# Patient Record
Sex: Male | Born: 1935 | Hispanic: Yes | Smoking: Former smoker
Health system: Southern US, Community
[De-identification: ages and names within clinical notes are randomized; demographics above are authoritative.]

## PROBLEM LIST (undated history)

## (undated) DIAGNOSIS — D490 Neoplasm of unspecified behavior of digestive system: Secondary | ICD-10-CM

## (undated) HISTORY — PX: COLOSTOMY: SHX63

## (undated) HISTORY — PX: FINGER SURGERY: SHX640

---

## 2017-06-27 ENCOUNTER — Encounter (HOSPITAL_COMMUNITY): Payer: Self-pay | Admitting: Cardiology

## 2017-06-27 ENCOUNTER — Emergency Department (HOSPITAL_COMMUNITY)
Admission: EM | Admit: 2017-06-27 | Discharge: 2017-06-27 | Disposition: A | Discharge status: Home / Self Care | Payer: Medicare Other | Attending: Emergency Medicine | Admitting: Emergency Medicine

## 2017-06-27 ENCOUNTER — Emergency Department (HOSPITAL_COMMUNITY): Payer: Medicare Other

## 2017-06-27 DIAGNOSIS — J189 Pneumonia, unspecified organism: Secondary | ICD-10-CM

## 2017-06-27 DIAGNOSIS — R531 Weakness: Secondary | ICD-10-CM | POA: Insufficient documentation

## 2017-06-27 DIAGNOSIS — Z87891 Personal history of nicotine dependence: Secondary | ICD-10-CM | POA: Insufficient documentation

## 2017-06-27 DIAGNOSIS — R51 Headache: Secondary | ICD-10-CM | POA: Diagnosis not present

## 2017-06-27 DIAGNOSIS — K5900 Constipation, unspecified: Secondary | ICD-10-CM | POA: Insufficient documentation

## 2017-06-27 DIAGNOSIS — R1084 Generalized abdominal pain: Secondary | ICD-10-CM | POA: Diagnosis present

## 2017-06-27 HISTORY — DX: Neoplasm of unspecified behavior of digestive system: D49.0

## 2017-06-27 LAB — COMPREHENSIVE METABOLIC PANEL
ALBUMIN: 3.3 g/dL — AB (ref 3.5–5.0)
ALK PHOS: 85 U/L (ref 38–126)
ALT: 15 U/L — ABNORMAL LOW (ref 17–63)
ANION GAP: 7 (ref 5–15)
AST: 18 U/L (ref 15–41)
BUN: 15 mg/dL (ref 6–20)
CALCIUM: 9.2 mg/dL (ref 8.9–10.3)
CO2: 27 mmol/L (ref 22–32)
Chloride: 105 mmol/L (ref 101–111)
Creatinine, Ser: 0.72 mg/dL (ref 0.61–1.24)
GFR calc Af Amer: >60 mL/min (ref >60)
GFR calc non Af Amer: >60 mL/min (ref >60)
GLUCOSE: 104 mg/dL — AB (ref 65–99)
Potassium: 4.1 mmol/L (ref 3.5–5.1)
SODIUM: 139 mmol/L (ref 135–145)
Total Bilirubin: 0.4 mg/dL (ref 0.3–1.2)
Total Protein: 6.9 g/dL (ref 6.5–8.1)

## 2017-06-27 LAB — URINALYSIS, ROUTINE W REFLEX MICROSCOPIC
Bilirubin Urine: NEGATIVE
Glucose, UA: NEGATIVE mg/dL
Hgb urine dipstick: NEGATIVE
Ketones, ur: NEGATIVE mg/dL
Nitrite: POSITIVE — AB
PROTEIN: NEGATIVE mg/dL
SPECIFIC GRAVITY, URINE: 1.011 (ref 1.005–1.030)
SQUAMOUS EPITHELIAL / LPF: NONE SEEN
pH: 6 (ref 5.0–8.0)

## 2017-06-27 LAB — CBC WITH DIFFERENTIAL/PLATELET
Basophils Absolute: 0 10*3/uL (ref 0.0–0.1)
Basophils Relative: 0 %
EOS ABS: 0.1 10*3/uL (ref 0.0–0.7)
Eosinophils Relative: 2 %
HEMATOCRIT: 32.6 % — AB (ref 39.0–52.0)
Hemoglobin: 10.4 g/dL — ABNORMAL LOW (ref 13.0–17.0)
LYMPHS ABS: 1.3 10*3/uL (ref 0.7–4.0)
LYMPHS PCT: 21 %
MCH: 27.8 pg (ref 26.0–34.0)
MCHC: 31.9 g/dL (ref 30.0–36.0)
MCV: 87.2 fL (ref 78.0–100.0)
MONOS PCT: 8 %
Monocytes Absolute: 0.5 10*3/uL (ref 0.1–1.0)
NEUTROS ABS: 4.2 10*3/uL (ref 1.7–7.7)
NEUTROS PCT: 69 %
Platelets: 260 10*3/uL (ref 150–400)
RBC: 3.74 MIL/uL — ABNORMAL LOW (ref 4.22–5.81)
RDW: 15.8 % — ABNORMAL HIGH (ref 11.5–15.5)
WBC: 6 10*3/uL (ref 4.0–10.5)

## 2017-06-27 LAB — AMMONIA: AMMONIA: 14 umol/L (ref 9–35)

## 2017-06-27 LAB — TROPONIN I: Troponin I: <0.03 ng/mL (ref <0.03)

## 2017-06-27 MED ORDER — IOPAMIDOL (ISOVUE-300) INJECTION 61%
75.0000 mL | Freq: Once | INTRAVENOUS | Status: AC | PRN
Start: 2017-06-27 — End: 2017-06-27
  Administered 2017-06-27: 75 mL via INTRAVENOUS

## 2017-06-27 MED ORDER — AZITHROMYCIN 250 MG PO TABS
500.0000 mg | ORAL_TABLET | Freq: Once | ORAL | Status: AC
  Administered 2017-06-27: 500 mg via ORAL

## 2017-06-27 MED ORDER — AZITHROMYCIN 250 MG PO TABS
1000.0000 mg | ORAL_TABLET | Freq: Once | ORAL | Status: DC
  Filled 2017-06-27: qty 4

## 2017-06-27 MED ORDER — AZITHROMYCIN 250 MG PO TABS: ORAL_TABLET | ORAL | 0 refills | Status: AC

## 2017-06-27 MED ORDER — DEXTROSE 5 % IV SOLN
1.0000 g | Freq: Once | INTRAVENOUS | Status: AC
  Administered 2017-06-27: 1 g via INTRAVENOUS
  Filled 2017-06-27: qty 10

## 2017-06-27 NOTE — Discharge Instructions (Signed)
Your blood work is well within normal limits. There is some abnormality of your urine. A culture has been sent to the lab. The CT scan of your chest shows emphysema, and questions pneumonia. Please use Zithromax daily starting tomorrow. Please increase your fluids, (water, juices, Gatorade). Please call Dr. Meda Coffee to establish a primary care physician. Please return to the emergency department if not improving before you're seen by Dr. Meda Coffee.

## 2017-06-27 NOTE — ED Notes (Signed)
Pt ambulatory to waiting room. Pt verbalized understanding of discharge instructions with assistance from interpreter.

## 2017-06-27 NOTE — ED Notes (Signed)
Pt's daughter states pt doesn't go to the bathroom independently. States he uses a cath.

## 2017-06-27 NOTE — ED Notes (Signed)
Pt gone to xray

## 2017-06-27 NOTE — ED Provider Notes (Signed)
[facility] DEPT Provider Note   CSN: [id number] Arrival date & time: 06/27/17  0175     History   Chief Complaint Chief Complaint  Patient presents with  . Abdominal Pain    HPI Walter Martinez is a 83 y.o. male.  Patient is an 83 year old male who presents to the emergency department with family because of not feeling well, and discomfort near his colostomy site.  Patient does not speak [place]. Family speaks limited [place]. Language interpreter utilized during the interview.  The patient states that he has been in the area for several years, but he goes to [place] if he feels sick. Approximately 2 months ago the patient went to [place] was noted to have some tumors in his colon and had a colostomy. He just returned from [place] 5 days ago. The patient states that he just doesn't feel well. He also has some discomfort at his colostomy site. When asked if he could describe the symptoms with not feeling well, the patient states that he has a lot of things on his mind. He feels that there are things involved in his life right now that he can't change, and he thinks he may be thinking too much.  The patient states he does not have any other medical issues. He denies fever, nausea, vomiting on. The patient previously used alcohol, but has not used alcohol at this time. He denies any illicit drugs. He states that he feels stressed, and has problems with headache now and he has headaches from time to time. His been no unusual cough or upper respiratory symptoms to be reported. No recent injury or trauma to the reported.     The history is provided by the patient and a relative. The history is limited by a language barrier. A language interpreter was used.  Abdominal Pain   Associated symptoms include headaches.    Past Medical History:  Diagnosis Date  . Colon tumor     There are no active problems to display for this patient.   Past Surgical History:  Procedure Laterality  Date  . COLOSTOMY    . FINGER SURGERY         Home Medications    Prior to Admission medications   Not on File    Family History History reviewed. No pertinent family history.  Social History Social History  Substance Use Topics  . Smoking status: Former Research scientist (life sciences)  . Smokeless tobacco: Never Used     Comment: quit smoking 30 years ago.   . Alcohol use No     Comment: states he used to drink      Allergies   Patient has no known allergies.   Review of Systems Review of Systems  Constitutional: Positive for fatigue.  Gastrointestinal: Positive for abdominal pain.  Neurological: Positive for headaches.  Psychiatric/Behavioral:       Stressed  All other systems reviewed and are negative.    Physical Exam Updated Vital Signs There were no vitals taken for this visit.  Physical Exam  Constitutional: He is oriented to person, place, and time. He appears well-developed and well-nourished.  Non-toxic appearance.  HENT:  Head: Normocephalic.  Right Ear: Tympanic membrane and external ear normal.  Left Ear: Tympanic membrane and external ear normal.  Eyes: Pupils are equal, round, and reactive to light. EOM and lids are normal.  Neck: Normal range of motion. Neck supple. Carotid bruit is not present.  Cardiovascular: Normal rate, regular rhythm, normal heart sounds, intact distal pulses and  normal pulses.   Pulmonary/Chest: Breath sounds normal. No respiratory distress.  Abdominal: Soft. Bowel sounds are normal. He exhibits no distension. There is no tenderness. There is no guarding.  Left abdomen colostomy.  Musculoskeletal: Normal range of motion. He exhibits no edema or tenderness.  Lymphadenopathy:       Head (right side): No submandibular adenopathy present.       Head (left side): No submandibular adenopathy present.    He has no cervical adenopathy.  Neurological: He is alert and oriented to person, place, and time. He has normal strength. No cranial nerve  deficit or sensory deficit. Coordination normal.  Skin: Skin is warm and dry. No rash noted.  Psychiatric: He has a normal mood and affect. His speech is normal and behavior is normal.  Nursing note and vitals reviewed.    ED Treatments / Results  Labs (all labs ordered are listed, but only abnormal results are displayed) Labs Reviewed  COMPREHENSIVE METABOLIC PANEL  TROPONIN I  LACTIC ACID, PLASMA  LACTIC ACID, PLASMA  CBC WITH DIFFERENTIAL/PLATELET  URINALYSIS, ROUTINE W REFLEX MICROSCOPIC  AMMONIA    EKG  EKG Interpretation None       Radiology No results found.  Procedures Procedures (including critical care time)  Medications Ordered in ED Medications - No data to display   Initial Impression / Assessment and Plan / ED Course  I have reviewed the triage vital signs and the nursing notes.  Pertinent labs & imaging results that were available during my care of the patient were reviewed by me and considered in my medical decision making (see chart for details).      Final Clinical Impressions(s) / ED Diagnoses MDM Vital signs reviewed.  Principal metabolic panel is well within normal limits. The anion gap is 7. Troponin is less than 0.03. Negative for emergent event. Electrocardiogram shows a normal sinus rhythm. Negative for emergent event. Complete blood count is nonacute. The ammonia level is 14 within normal limits. The urinalysis shows positive nitrates and small leukocyte esterase. There is 0-5 red blood cells 6-30 white blood cells. Urine Culture sent to the lab. The acute abdomen x-ray shows chronic obstructive pulmonary disease changes, and an area suspicious for right lung infiltrates. There is increased stool burden noted throughout the colon colostomy is noted at the left lower quadrant.  Case discussed and pt seen by Dr Lita Mains.  Given the patient's history of recent colon surgery, a CT scan was obtained to evaluate for possible pneumonia, versus  atelectasis, persists carcinoma. The CT scan shows a medium-sized pleural effusion with adjacent right lower lobe opacities questioning atelectasis, there was motion however pneumonia cannot be completely excluded. There is no acute vascular abnormality noted. There was some enlargement of the right pulmonary artery.  Patient was treated in the emergency department with intravenous Rocephin, and oral Zithromax. Prescription for Zithromax given to the patient. The patient is referred to Dr. Meda Coffee to establish a primary care physician and for follow-up of these medical issues. Using the telemetry-interpreter, these results and findings were discussed with the patient in terms which he understands. The plan for him to be evaluated by a primary physician and for follow-up was explained. The need for increase fiber, and or MiraLAX was also explained to the patient. And the need to increase fluids as well as finish the course of the antibiotic was also explained to the patient. Patient and family are in agreement with this plan.    Final diagnoses:  Community acquired  pneumonia of right lung, unspecified part of lung  Weakness  Constipation, unspecified constipation type    New Prescriptions Discharge Medication List as of 06/27/2017  6:51 PM    START taking these medications   Details  azithromycin (ZITHROMAX) 250 MG tablet Take 1 po daily with food, Print         Lily Kocher, PA-C 06/28/17 0907    Lily Kocher, PA-C 06/28/17 9021    Julianne Rice, MD 07/01/17 [phone]

## 2017-06-27 NOTE — ED Notes (Signed)
EKG given to Lily Kocher, PA and Dr. Lita Mains.

## 2017-06-27 NOTE — ED Notes (Signed)
Pt has been here throughout downtime and has had multiple hourly roundings and has been sent to CT.

## 2017-06-27 NOTE — ED Triage Notes (Addendum)
Per translator Pt having pain at colostomy site for 2 months. Colostomy was placed in [place] 2 months ago.    And states he just feels bad.  Pt does not speak [place].

## 2017-06-30 LAB — URINE CULTURE: Culture: >=100000 — AB

## 2017-07-01 ENCOUNTER — Telehealth: Payer: Self-pay

## 2017-07-01 NOTE — Progress Notes (Signed)
ED Antimicrobial Stewardship Positive Culture Follow Up   Walter Martinez is an 83 y.o. male who presented to [facility] on 06/27/2017 with a chief complaint of  Chief Complaint  Patient presents with  . Abdominal Pain    Recent Results (from the past 720 hour(s))  Urine culture     Status: Abnormal   Collection Time: 06/27/17  6:25 PM  Result Value Ref Range Status   Specimen Description URINE, CLEAN CATCH  Final   Special Requests NONE  Final   Culture (A)  Final    >=100,000 COLONIES/mL ESCHERICHIA COLI Confirmed Extended Spectrum Beta-Lactamase Producer (ESBL) Performed at [facility], [address] [place], [place] [address]    Report Status 06/30/2017 FINAL  Final   Organism ID, Bacteria ESCHERICHIA COLI (A)  Final      Susceptibility   Escherichia coli - MIC*    AMPICILLIN >=32 RESISTANT Resistant     CEFAZOLIN >=64 RESISTANT Resistant     CEFTRIAXONE >=64 RESISTANT Resistant     CIPROFLOXACIN >=4 RESISTANT Resistant     GENTAMICIN <=1 SENSITIVE Sensitive     IMIPENEM <=0.25 SENSITIVE Sensitive     NITROFURANTOIN 64 INTERMEDIATE Intermediate     TRIMETH/SULFA >=320 RESISTANT Resistant     AMPICILLIN/SULBACTAM 16 INTERMEDIATE Intermediate     PIP/TAZO <=4 SENSITIVE Sensitive     Extended ESBL POSITIVE Resistant     * >=100,000 COLONIES/mL ESCHERICHIA COLI    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 No urinary symptoms. Discharged on abx for possible PNA.  New antibiotic prescription: No further treatment necessary  ED Provider: Arlean Hopping, [place]  Elicia Lamp, PharmD, BCPS Clinical Pharmacist 07/01/2017 9:58 AM

## 2017-07-01 NOTE — Telephone Encounter (Signed)
No further treatment for [facility] ED 06/27/17 per Arlean Hopping PA

## 2018-06-24 IMAGING — CT CT CHEST W/ CM
2 of 3 series · 15 of 36 positions shown, 18 images · IV contrast (iopamidol)
Comparison: Chest radiograph 06/27/2017

CLINICAL DATA: Suspected right lung base pneumonia.

EXAM:
CT CHEST WITH CONTRAST
TECHNIQUE: Multidetector CT imaging of the chest was performed during
intravenous contrast administration.
CONTRAST:  75mL [id number] IOPAMIDOL ([id number]) INJECTION 61%

[Series 2: axial st · axial · 0.65mm/px · z∈[-244,+28]mm · 12 of 160 slices shown, 15 images]
[im 12/160  mediastinal]
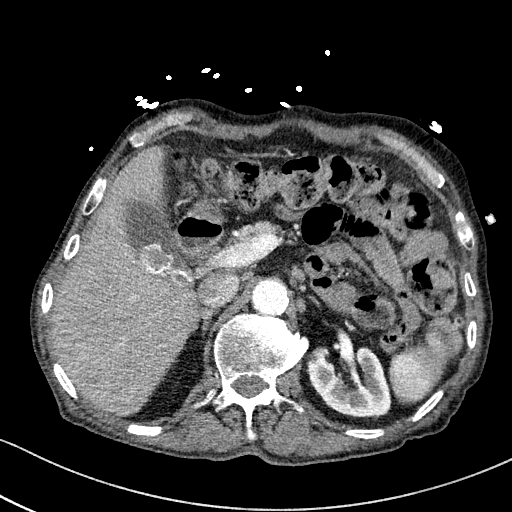
[im 12/160  lung]
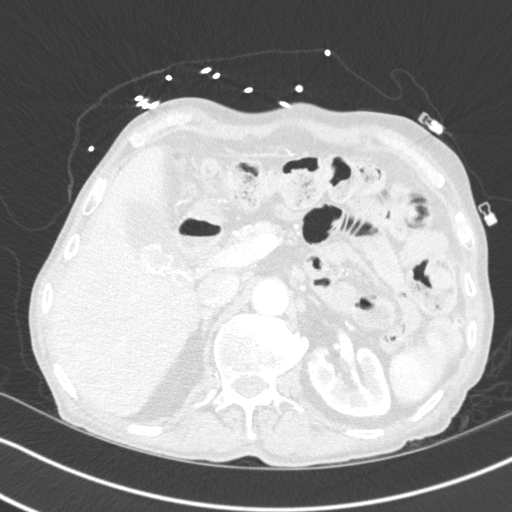
[im 24/160  lung]
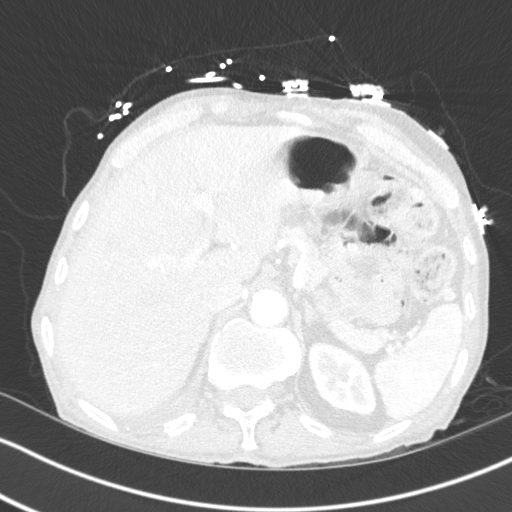
[im 36/160  lung]
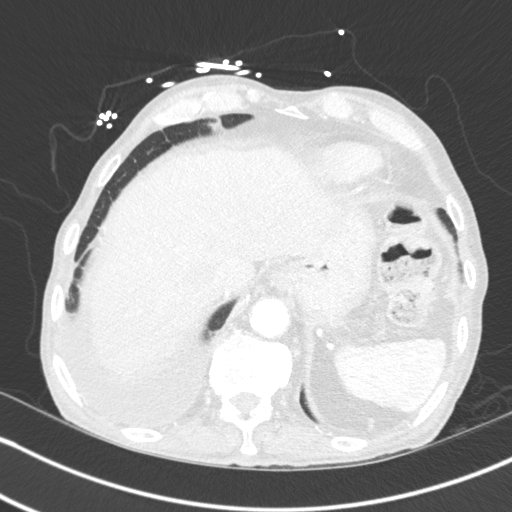
[im 48/160  lung]
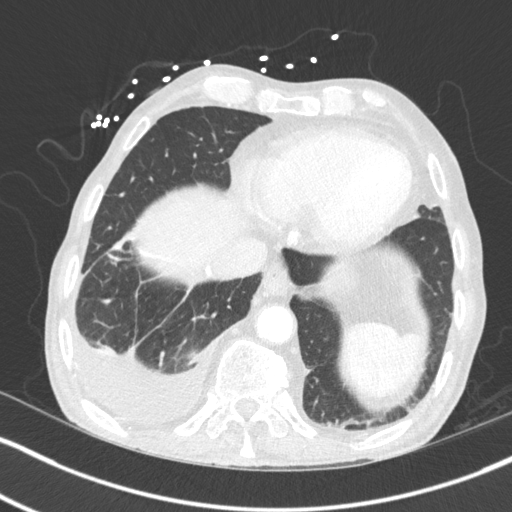
[im 59/160  mediastinal]
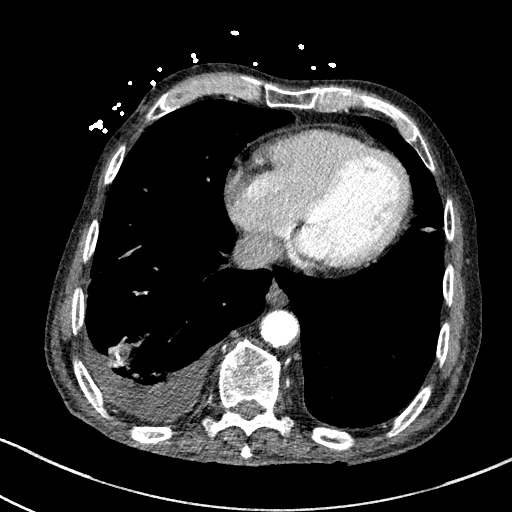
[im 59/160  lung]
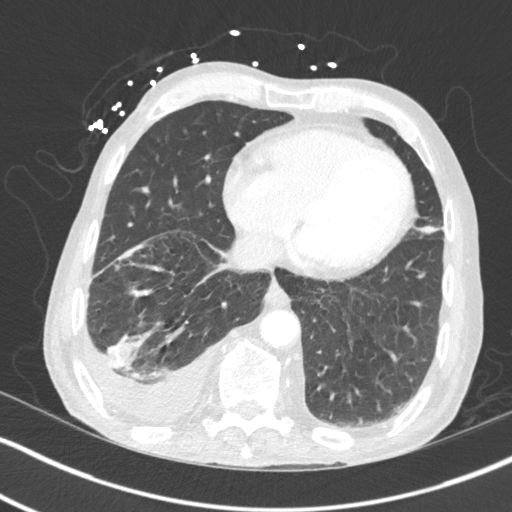
[im 71/160  lung]
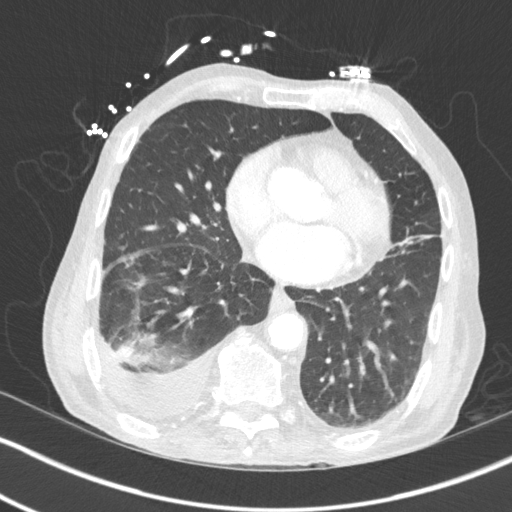
[im 89/160  lung]
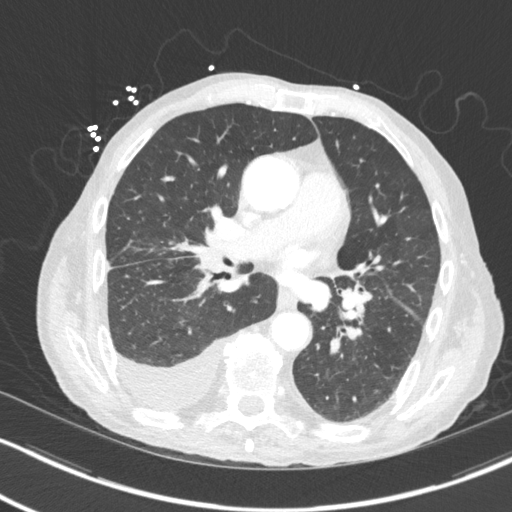
[im 101/160  lung]
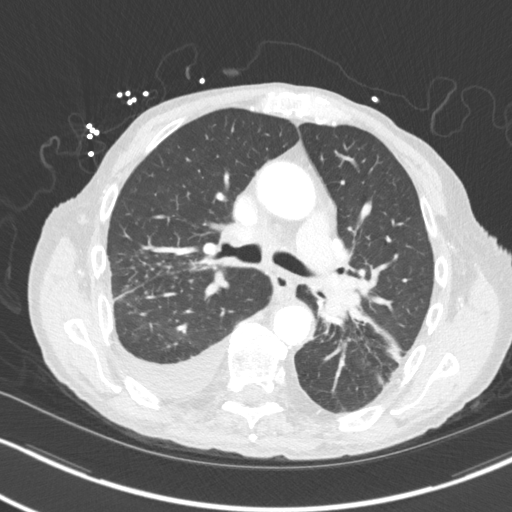
[im 112/160  mediastinal]
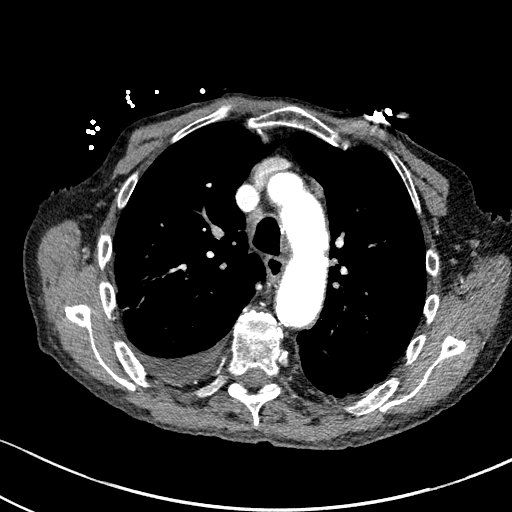
[im 112/160  lung]
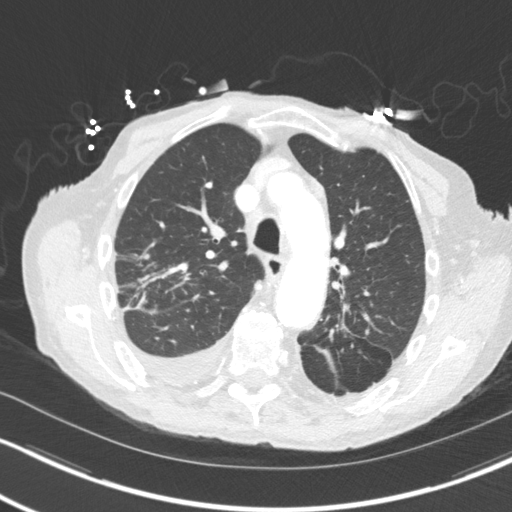
[im 124/160  lung]
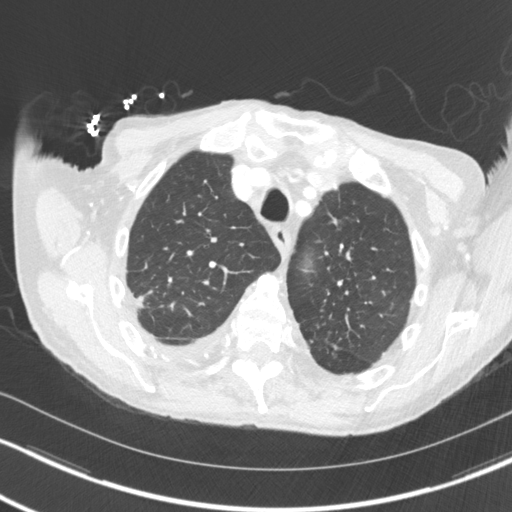
[im 136/160  lung]
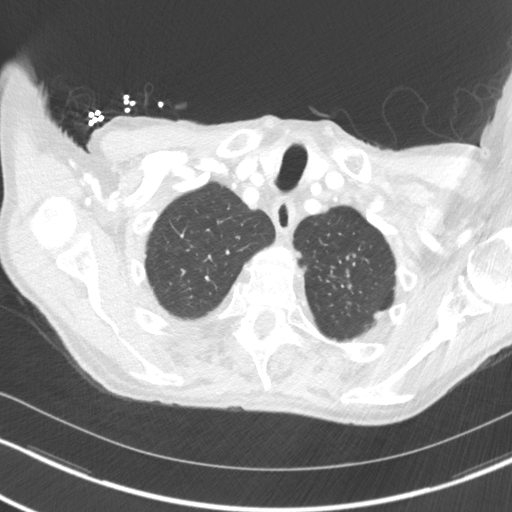
[im 148/160  lung]
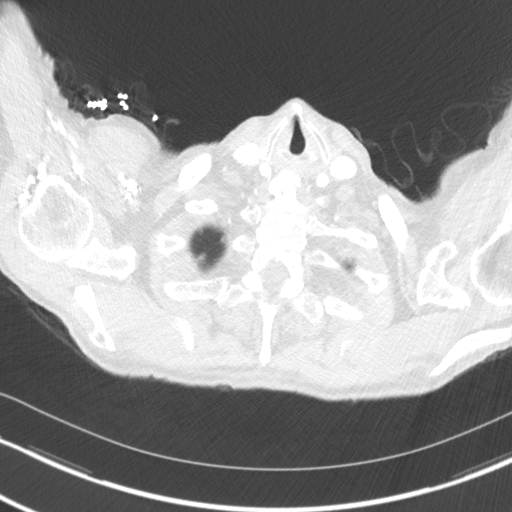

[Series 5: coronal · coronal · 0.56mm/px · 3 of 134 slices shown]
[im 27/134  lung]
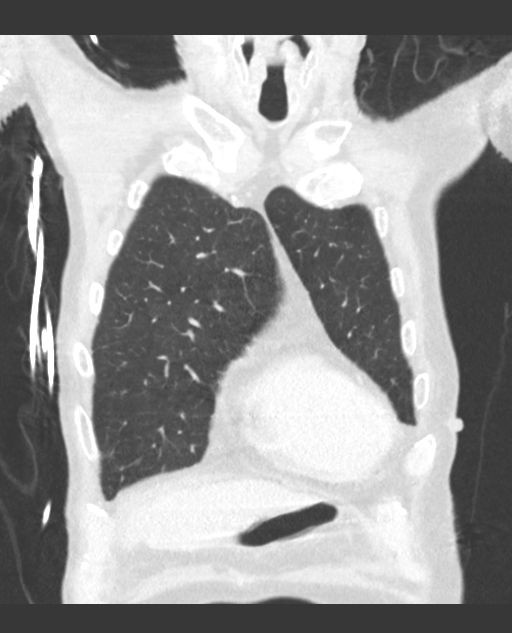
[im 54/134  lung]
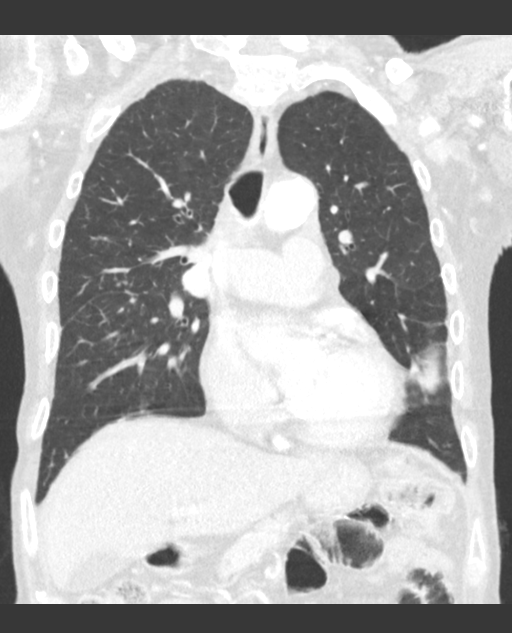
[im 80/134  lung]
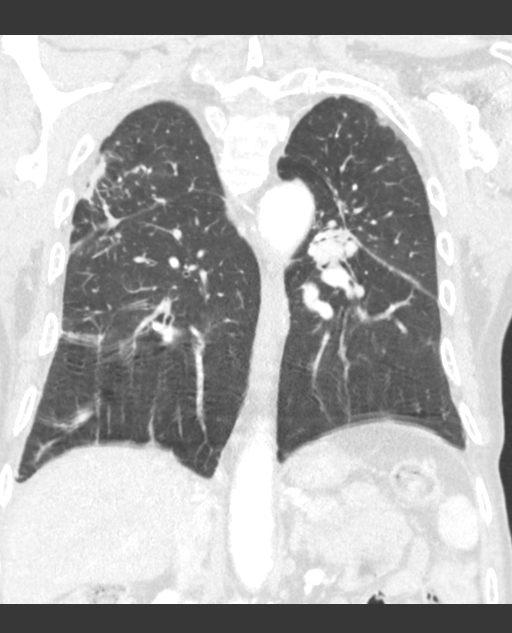

[15 of 36 positions shown; findings below may reference images not displayed]

FINDINGS: Cardiovascular: There is calcific aortic atherosclerosis. The
ascending thoracic aorta measures 3.5 cm in diameter. No aortic
dissection. There is a normal variant aortic arch branching pattern
with the brachiocephalic and left common carotid arteries sharing a
common origin. The proximal arch vessels are normal. The main
pulmonary artery measures at the upper limits of normal. The right
pulmonary artery is dilated, measuring 2.7 cm. Heart size is normal.
No pericardial effusion.

Mediastinum/Nodes: No mediastinal, hilar or axillary
lymphadenopathy. The visualized thyroid and thoracic esophageal
course are unremarkable.

Lungs/Pleura: Respiratory motion obscures evaluation of the lower
lobes. There are streaky opacities in the anterior segment of the
right upper lobe and in the posterior right lower lobe. There is a
medium-sized right pleural effusion. Hyperexpanded, emphysematous
lungs.

Upper Abdomen: Peripherally calcified gallstones. Otherwise
unremarkable upper abdomen.

Musculoskeletal: Multilevel thoracic osteophytosis. No bony spinal
canal stenosis. No lytic or blastic lesions.
IMPRESSION: 1. Medium-sized pleural effusion with adjacent right lower lobe
opacities favored to indicate atelectasis. This area is
unfortunately obscured by respiratory motion. In the appropriate
clinical context, pneumonia would be difficult to exclude.
2. Aortic Atherosclerosis (DSC8S-ZSZ.Z) and Emphysema (DSC8S-M0M.5).
3. No acute vascular abnormality.  Enlarged right pulmonary artery.

## 2018-07-21 DEATH — deceased
